# Patient Record
Sex: Male | Born: 1980 | Race: Black or African American | Hispanic: No | Marital: Single | Smoking: Current some day smoker
Health system: Southern US, Community
[De-identification: ages and names within clinical notes are randomized; demographics above are authoritative.]

## PROBLEM LIST (undated history)

## (undated) DIAGNOSIS — I10 Essential (primary) hypertension: Secondary | ICD-10-CM

---

## 2009-05-21 ENCOUNTER — Emergency Department (HOSPITAL_COMMUNITY): Admission: EM | Admit: 2009-05-21 | Discharge: 2009-05-21 | Payer: Self-pay | Admitting: Emergency Medicine

## 2010-08-24 LAB — CBC
Hemoglobin: 14 g/dL (ref 13.0–17.0)
MCHC: 32.8 g/dL (ref 30.0–36.0)
MCV: 83 fL (ref 78.0–100.0)
RDW: 15 % (ref 11.5–15.5)

## 2010-08-24 LAB — URINALYSIS, ROUTINE W REFLEX MICROSCOPIC
Hgb urine dipstick: NEGATIVE
Ketones, ur: NEGATIVE mg/dL
Protein, ur: NEGATIVE mg/dL
Urobilinogen, UA: 0.2 mg/dL (ref 0.0–1.0)

## 2014-01-07 ENCOUNTER — Encounter (HOSPITAL_BASED_OUTPATIENT_CLINIC_OR_DEPARTMENT_OTHER): Payer: Self-pay | Admitting: Emergency Medicine

## 2014-01-07 ENCOUNTER — Emergency Department (HOSPITAL_BASED_OUTPATIENT_CLINIC_OR_DEPARTMENT_OTHER)
Admission: EM | Admit: 2014-01-07 | Discharge: 2014-01-07 | Disposition: A | Discharge status: Home / Self Care | Payer: Self-pay | Attending: Emergency Medicine | Admitting: Emergency Medicine

## 2014-01-07 DIAGNOSIS — K625 Hemorrhage of anus and rectum: Secondary | ICD-10-CM

## 2014-01-07 DIAGNOSIS — F17200 Nicotine dependence, unspecified, uncomplicated: Secondary | ICD-10-CM | POA: Insufficient documentation

## 2014-01-07 DIAGNOSIS — Z79899 Other long term (current) drug therapy: Secondary | ICD-10-CM | POA: Insufficient documentation

## 2014-01-07 DIAGNOSIS — I10 Essential (primary) hypertension: Secondary | ICD-10-CM | POA: Insufficient documentation

## 2014-01-07 DIAGNOSIS — K649 Unspecified hemorrhoids: Secondary | ICD-10-CM | POA: Insufficient documentation

## 2014-01-07 DIAGNOSIS — K645 Perianal venous thrombosis: Secondary | ICD-10-CM | POA: Insufficient documentation

## 2014-01-07 HISTORY — DX: Essential (primary) hypertension: I10

## 2014-01-07 LAB — CBC WITH DIFFERENTIAL/PLATELET
Basophils Absolute: 0 10*3/uL (ref 0.0–0.1)
Basophils Relative: 0 % (ref 0–1)
Eosinophils Absolute: 0.1 10*3/uL (ref 0.0–0.7)
Eosinophils Relative: 2 % (ref 0–5)
HCT: 40.1 % (ref 39.0–52.0)
HEMOGLOBIN: 13.4 g/dL (ref 13.0–17.0)
LYMPHS ABS: 2.1 10*3/uL (ref 0.7–4.0)
Lymphocytes Relative: 26 % (ref 12–46)
MCH: 28.6 pg (ref 26.0–34.0)
MCHC: 33.4 g/dL (ref 30.0–36.0)
MCV: 85.5 fL (ref 78.0–100.0)
MONOS PCT: 8 % (ref 3–12)
Monocytes Absolute: 0.7 10*3/uL (ref 0.1–1.0)
NEUTROS ABS: 5.3 10*3/uL (ref 1.7–7.7)
NEUTROS PCT: 64 % (ref 43–77)
Platelets: 194 10*3/uL (ref 150–400)
RBC: 4.69 MIL/uL (ref 4.22–5.81)
RDW: 14.4 % (ref 11.5–15.5)
WBC: 8.2 10*3/uL (ref 4.0–10.5)

## 2014-01-07 LAB — BASIC METABOLIC PANEL
ANION GAP: 10 (ref 5–15)
BUN: 17 mg/dL (ref 6–23)
CHLORIDE: 101 meq/L (ref 96–112)
CO2: 29 mEq/L (ref 19–32)
Calcium: 9.5 mg/dL (ref 8.4–10.5)
Creatinine, Ser: 1 mg/dL (ref 0.50–1.35)
GFR calc non Af Amer: >90 mL/min (ref >90)
Glucose, Bld: 109 mg/dL — ABNORMAL HIGH (ref 70–99)
POTASSIUM: 3.7 meq/L (ref 3.7–5.3)
Sodium: 140 mEq/L (ref 137–147)

## 2014-01-07 MED ORDER — HYDROCODONE-ACETAMINOPHEN 5-325 MG PO TABS: 1.0000 | ORAL_TABLET | Freq: Four times a day (QID) | ORAL | Status: AC | PRN

## 2014-01-07 NOTE — Discharge Instructions (Signed)
Continue the warm water soaks that she been doing. Take the pain medication judiciously and as prescribed. Return for any increased bleeding. Resource guide provided below to help you find a primary care Dr. Shireen Quanoday's blood counts are well within normal limits.   Emergency Department Resource Guide 1) Find a Doctor and Pay Out of Pocket Although you won't have to find out who is covered by your insurance plan, it is a good idea to ask around and get recommendations. You will then need to call the office and see if the doctor you have chosen will accept you as a new patient and what types of options they offer for patients who are self-pay. Some doctors offer discounts or will set up payment plans for their patients who do not have insurance, but you will need to ask so you aren't surprised when you get to your appointment.  2) Contact Your Local Health Department Not all health departments have doctors that can see patients for sick visits, but many do, so it is worth a call to see if yours does. If you don't know where your local health department is, you can check in your phone book. The CDC also has a tool to help you locate your state's health department, and many state websites also have listings of all of their local health departments.  3) Find a Walk-in Clinic If your illness is not likely to be very severe or complicated, you may want to try a walk in clinic. These are popping up all over the country in pharmacies, drugstores, and shopping centers. They're usually staffed by nurse practitioners or physician assistants that have been trained to treat common illnesses and complaints. They're usually fairly quick and inexpensive. However, if you have serious medical issues or chronic medical problems, these are probably not your best option.  No Primary Care Doctor: - Call Health Connect at  [phone] - they can help you locate a primary care doctor that  accepts your insurance, provides certain  services, etc. - Physician Referral Service- [phone]  Chronic Pain Problems: Organization         Address  Phone   Notes  [facility]  [phone] Patients need to be referred by their primary care doctor.   Medication Assistance: Organization         Address  Phone   Notes  [facility] [address] [place], [place] [address] [phone] --Must be a resident of [place] -- Must have NO insurance coverage whatsoever (no Medicaid/ Medicare, etc.) -- The pt. MUST have a primary care doctor that directs their care regularly and follows them in the community   MedAssist  [phone]   [employer]  [phone]    Agencies that provide inexpensive medical care: Organization         Address  Phone   Notes  [facility]  [phone]   [facility]    [phone]   [facility] [address] [place], [place] [address] [phone]   [facility] [address], [place] [phone]   Planned Parenthood    [phone]   [facility]    [phone]   [facility]  [address],  Phone:  [phone], Fax:  [phone] Hours of Operation:  9 am - 6 pm, M-F.  Also accepts  Medicaid/Medicare and self-pay.  [facility] [place], [address], [place] Phone: [phone], Fax: [phone]. Hours of Operation:  8:30 am - 5:30 pm, M-F.  Also accepts Medicaid and self-pay.  [facility] [place] [address], [place] Phone: [phone]   [place], [place], [place] [phone], Ext. [phone] Mondays & Thursdays: 7-9 AM.  First 15 patients are seen on a first come, first serve basis.    [place] Providers:  Organization         Address  Phone   Notes  [facility] [address], [place] [phone] Also accepts self-pay patients.  [facility] [id number] [place], [place]  [phone]   [place], [address], [place] [phone]   [facility] [address] [place] [phone]   Lucianne Lei [address], [place]   [phone] Only accepts [place] Access [place] patients after they have their name applied to their card.   Self-Pay (no insurance) in [facility]:  Organization         Address  Phone   Notes  Sickle Cell Patients, [facility] [place] [phone]   [facility] [place] [phone]   [facility] [place]  [place], [place], [place] [phone]   [facility]/Dr. Osei-Bonsu  [address], [place] or [address], [place] [phone] Phone number for both [place] and [place] locations is the same.  [facility] [address] [place] [phone]   [facility] [address] [place] or [address] [phone]   [facility] [address] [place] [phone], phone; [phone], fax Sees patients 1st and 3rd Saturday of every month.  Must not qualify for public or private insurance (i.e. Medicaid, Medicare, [place] Health Choice, Veterans' Benefits)  Household income should be no more than 200% of the poverty level The clinic cannot treat you if you are pregnant or think you are pregnant  Sexually transmitted diseases are not treated at the clinic.    Dental Care: Organization         Address  Phone  Notes  [facility] Department of [facility] [place] [phone] Accepts  children up to age 47 who are enrolled in [place] or [place]; pregnant women with a Medicaid card; and children who have applied for Medicaid or [place] Health Choice, but were declined, whose parents can pay a reduced fee at time of service.  [facility] Department of [facility]  [address], [place] [phone] Accepts children up to age 68 who are enrolled in [place] or [place]; pregnant women with a Medicaid card; and children who have applied for Medicaid or [place] Health Choice, but were declined, whose parents can pay a reduced fee at time of service.  [place] [facility] Access PROGRAM  [place] [phone] Patients are seen by appointment only. Walk-ins are not accepted. [place] will see patients 38 years of age and older. Monday - Tuesday (8am-5pm) Most  Wednesdays (8:30-5pm) $30 per visit, cash only  [facility] Adult Dental Access PROGRAM  [address], [facility] [phone] Patients are seen by appointment only. Walk-ins are not accepted. [place] will see patients 71 years of age and older. One Wednesday Evening (Monthly: Volunteer Based).  $30 per visit, cash only  [place]  [phone] for adults; Children under age 31, call Graduate Pediatric Dentistry at [phone]. Children aged 59-14, please call [phone] to request a pediatric application.  Dental services are provided in all areas of dental care including fillings, crowns and bridges, complete and partial dentures, implants, gum treatment, root canals, and extractions. Preventive care is also provided. Treatment is provided to both adults and children. Patients are selected via a lottery and there is often a waiting list.   [facility] [address] [place]  [phone] [email or web address]   [facility] [address] [place], [place] [phone], Ext. [phone] Second and  Fourth Thursday of each month, opens at 6:30 AM; Clinic ends at 9 AM.  Patients are seen on a first-come first-served basis, and a limited number are seen during each clinic.   [facility]  [address] Hillard Danker [place], [place] [phone]   Eligibility Requirements You must have lived in [place], [place], or [place] counties for at least the last three months.   You cannot be eligible for state or federal sponsored [employer], including [employer], [place], or [employer].   You generally cannot be eligible for healthcare insurance through your employer.    How to apply: Eligibility screenings are held every Tuesday and Wednesday afternoon from 1:00 pm until 4:00 pm. You do not need an appointment for the interview!  [facility] [address], [place], [place]   [place]  [place] Department  [place]  [phone]    Behavioral Health Resources in the Community: Intensive Outpatient Programs Organization         Address  Phone  Notes  [place]. [address], [place], [place] [phone]   [facility] Outpatient [address] [place], [place]   ADS: Alcohol & Drug Svcs [address], [place], [place]   [place] [address],  [place], [place] or [phone]   Substance Abuse Resources Organization         Address  Phone  Notes  Alcohol and Drug Services  [phone]   [place]  [phone]   [facility] [place]   Chinita Pester  [phone]   Residential & Outpatient Substance Abuse Program  [phone]   Psychological Services Organization         Address  Phone  Notes  [facility] [place]  [place]  [phone]   [place]  [address], [place] or [phone]    Mobile Crisis Teams Organization         Address  Phone  Notes  [facility], Mobile Crisis Care Unit  [phone]   [facility]  [address] [place], [place]   [place] Levels [address] [place] [phone]    Self-Help/Support Groups Organization         Address  Phone  Notes  [facility]oc[facility] of [place] - variety of support groups  [place] Call for more information  Narcotics Anonymous (NA), [facility] [address], [employer] [place]  2 meetings at this location   Special educational needs teacher         Address  Phone  Notes  ASAP Residential Treatment [place],    [place]  [phone]   [facility]  [address] [place] [address]2831, [place], [place]   [place], [place] [phone] Admissions: 8am-3pm M-F  Incentives Substance [place] [address]    [place], [place] [phone]   [facility] [address] [place], [place], [place]   [facility] [address],  [place], [place]   [facility] - Intensive Outpatient [address] Kristeen Mans [address], [place], [place]   [facility] [address]  [place], [place] [phone] or [phone]   [facility] (RTS) [address] [place], [place] Accepts Medicaid  [facility] [address],  [place] [phone] Substance Abuse/Addiction Treatment   [facility] Organization         Address  Phone  Notes  [facility]  [phone]   Domenic Schwab, PhD [address] Arlis Porta [place], [place]   [phone] or [phone]   [place], [place] [phone]   [facility] [address] [place], [place] [phone] Insurance/Medicaid/sponsorship through [facility] and Families [address], [address] [place]                                    [place], [place] [phone] [place] [address], [place] [phone]    Dr. Adele Schilder  [phone]   [facility] Dept. 1) [address] [place] 2) [place] 3)  [place] 65, [place] [phone]  [phone]   [place] [phone] or [phone] (After Hours)

## 2014-01-07 NOTE — ED Provider Notes (Signed)
CSN: [id number]     Arrival date & time 01/07/14  1251 History   First MD Initiated Contact with Patient 01/07/14 1332     Chief Complaint  Patient presents with  . Hemorrhoids     (Consider location/radiation/quality/duration/timing/severity/associated sxs/prior Treatment) The history is provided by the patient.   33 year old male with 3 day history of rectal bleeding and discomfort felt to be due to hemorrhoids. No bowel pain no nausea no vomiting no fevers hurting to PE. Does not feel lightheaded or feel like his that pass out.  Past Medical History  Diagnosis Date  . Hypertension    History reviewed. No pertinent past surgical history. History reviewed. No pertinent family history. History  Substance Use Topics  . Smoking status: Current Some Day Smoker  . Smokeless tobacco: Not on file  . Alcohol Use: No    Review of Systems  Constitutional: Negative for fever.  HENT: Negative for congestion.   Eyes: Negative for redness.  Respiratory: Negative for shortness of breath.   Cardiovascular: Negative for chest pain.  Gastrointestinal: Positive for anal bleeding. Negative for nausea, vomiting and abdominal pain.  Genitourinary: Negative for dysuria.  Musculoskeletal: Negative for back pain.  Skin: Negative for rash.  Neurological: Negative for headaches.  Hematological: Does not bruise/bleed easily.  Psychiatric/Behavioral: Negative for confusion.      Allergies  Review of patient's allergies indicates no known allergies.  Home Medications   Prior to Admission medications   Medication Sig Start Date End Date Taking? Authorizing Provider  hydrochlorothiazide (HYDRODIURIL) 25 MG tablet Take 25 mg by mouth daily.   Yes Historical Provider, MD  HYDROcodone-acetaminophen (NORCO/VICODIN) 5-325 MG per tablet Take 1-2 tablets by mouth every 6 (six) hours as needed for moderate pain. 01/07/14   Vanetta MuldersScott Ahron Hulbert, MD   BP 144/85  Pulse 70  Temp(Src) 98.4 F (36.9 C) (Oral)   Resp 16  Ht 6' (1.829 m)  Wt 165 lb (74.844 kg)  BMI 22.37 kg/m2  SpO2 100% Physical Exam  Nursing note and vitals reviewed. Constitutional: He is oriented to person, place, and time. He appears well-developed and well-nourished. No distress.  HENT:  Head: Normocephalic and atraumatic.  Mouth/Throat: Oropharynx is clear and moist.  Eyes: Conjunctivae and EOM are normal. Pupils are equal, round, and reactive to light.  Neck: Normal range of motion.  Cardiovascular: Normal rate, regular rhythm and normal heart sounds.   Pulmonary/Chest: Effort normal and breath sounds normal. No respiratory distress.  Abdominal: Soft. Bowel sounds are normal. There is no tenderness.  Genitourinary:  Patient with skin tags and thrombosed external hemorrhoid with clot and some bleeding. No prolapsed internal hemorrhoids.  Neurological: He is alert and oriented to person, place, and time. No cranial nerve deficit. He exhibits normal muscle tone. Coordination normal.  Skin: Skin is warm. No rash noted.    ED Course  Procedures (including critical care time) Labs Review Labs Reviewed  BASIC METABOLIC PANEL - Abnormal; Notable for the following:    Glucose, Bld 109 (*)    All other components within normal limits  CBC WITH DIFFERENTIAL   Results for orders placed during the hospital encounter of 01/07/14  BASIC METABOLIC PANEL      Result Value Ref Range   Sodium 140  137 - 147 mEq/L   Potassium 3.7  3.7 - 5.3 mEq/L   Chloride 101  96 - 112 mEq/L   CO2 29  19 - 32 mEq/L   Glucose, Bld 109 (*) 70 - 99  mg/dL   BUN 17  6 - 23 mg/dL   Creatinine, Ser 1.61  0.50 - 1.35 mg/dL   Calcium 9.5  8.4 - 09.6 mg/dL   GFR calc non Af Amer >90  >90 mL/min   GFR calc Af Amer >90  >90 mL/min   Anion gap 10  5 - 15  CBC WITH DIFFERENTIAL      Result Value Ref Range   WBC 8.2  4.0 - 10.5 K/uL   RBC 4.69  4.22 - 5.81 MIL/uL   Hemoglobin 13.4  13.0 - 17.0 g/dL   HCT 04.5  40.9 - 81.1 %   MCV 85.5  78.0 - 100.0  fL   MCH 28.6  26.0 - 34.0 pg   MCHC 33.4  30.0 - 36.0 g/dL   RDW 91.4  78.2 - 95.6 %   Platelets 194  150 - 400 K/uL   Neutrophils Relative % 64  43 - 77 %   Neutro Abs 5.3  1.7 - 7.7 K/uL   Lymphocytes Relative 26  12 - 46 %   Lymphs Abs 2.1  0.7 - 4.0 K/uL   Monocytes Relative 8  3 - 12 %   Monocytes Absolute 0.7  0.1 - 1.0 K/uL   Eosinophils Relative 2  0 - 5 %   Eosinophils Absolute 0.1  0.0 - 0.7 K/uL   Basophils Relative 0  0 - 1 %   Basophils Absolute 0.0  0.0 - 0.1 K/uL     Imaging Review No results found.   EKG Interpretation None      MDM   Final diagnoses:  Rectal bleeding  Thrombosed external hemorrhoid    Patient with some rectal bleeding most likely related to hemorrhoids. Suspect internal hemorrhoids as well as external. External is visible with a thrombosed area that could have caused some of the bleeding. Patient's had a history of internal hemorrhoids in the past. No significant blood loss based on labs today. Patient will continue sitz baths. Pain medication provided work note provided. Patient will return for any new or worse symptoms. Patient's vital signs showed no tachycardia no hypotension.    Vanetta Mulders, MD 01/07/14 1530

## 2014-01-07 NOTE — ED Notes (Signed)
Pt c/o hemorrhoid bleeding x 3 days

## 2019-07-22 ENCOUNTER — Emergency Department (HOSPITAL_BASED_OUTPATIENT_CLINIC_OR_DEPARTMENT_OTHER): Payer: No Typology Code available for payment source

## 2019-07-22 ENCOUNTER — Encounter (HOSPITAL_BASED_OUTPATIENT_CLINIC_OR_DEPARTMENT_OTHER): Payer: Self-pay

## 2019-07-22 ENCOUNTER — Other Ambulatory Visit: Payer: Self-pay

## 2019-07-22 ENCOUNTER — Emergency Department (HOSPITAL_BASED_OUTPATIENT_CLINIC_OR_DEPARTMENT_OTHER)
Admission: EM | Admit: 2019-07-22 | Discharge: 2019-07-22 | Disposition: A | Discharge status: Home / Self Care | Payer: No Typology Code available for payment source | Attending: Emergency Medicine | Admitting: Emergency Medicine

## 2019-07-22 DIAGNOSIS — Y9389 Activity, other specified: Secondary | ICD-10-CM | POA: Insufficient documentation

## 2019-07-22 DIAGNOSIS — F17210 Nicotine dependence, cigarettes, uncomplicated: Secondary | ICD-10-CM | POA: Insufficient documentation

## 2019-07-22 DIAGNOSIS — S022XXA Fracture of nasal bones, initial encounter for closed fracture: Secondary | ICD-10-CM | POA: Insufficient documentation

## 2019-07-22 DIAGNOSIS — Y999 Unspecified external cause status: Secondary | ICD-10-CM | POA: Diagnosis not present

## 2019-07-22 DIAGNOSIS — Y92410 Unspecified street and highway as the place of occurrence of the external cause: Secondary | ICD-10-CM | POA: Diagnosis not present

## 2019-07-22 DIAGNOSIS — I10 Essential (primary) hypertension: Secondary | ICD-10-CM | POA: Insufficient documentation

## 2019-07-22 DIAGNOSIS — S0990XA Unspecified injury of head, initial encounter: Secondary | ICD-10-CM | POA: Diagnosis present

## 2019-07-22 MED ORDER — METHOCARBAMOL 500 MG PO TABS: 500.0000 mg | ORAL_TABLET | Freq: Three times a day (TID) | ORAL | 0 refills | Status: AC | PRN

## 2019-07-22 MED ORDER — KETOROLAC TROMETHAMINE 30 MG/ML IJ SOLN
30.0000 mg | Freq: Once | INTRAMUSCULAR | Status: AC
  Administered 2019-07-22: 19:00:00 30 mg via INTRAMUSCULAR
  Filled 2019-07-22: qty 1

## 2019-07-22 MED ORDER — IBUPROFEN 800 MG PO TABS: 800.0000 mg | ORAL_TABLET | Freq: Three times a day (TID) | ORAL | 0 refills | Status: AC | PRN
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
## 2020-01-05 ENCOUNTER — Encounter (HOSPITAL_BASED_OUTPATIENT_CLINIC_OR_DEPARTMENT_OTHER): Payer: Self-pay | Admitting: Emergency Medicine

## 2020-01-05 ENCOUNTER — Emergency Department (HOSPITAL_BASED_OUTPATIENT_CLINIC_OR_DEPARTMENT_OTHER)
Admission: EM | Admit: 2020-01-05 | Discharge: 2020-01-05 | Disposition: A | Discharge status: Home / Self Care | Payer: Self-pay | Attending: Emergency Medicine | Admitting: Emergency Medicine

## 2020-01-05 ENCOUNTER — Other Ambulatory Visit: Payer: Self-pay

## 2020-01-05 DIAGNOSIS — Y999 Unspecified external cause status: Secondary | ICD-10-CM | POA: Insufficient documentation

## 2020-01-05 DIAGNOSIS — Y92410 Unspecified street and highway as the place of occurrence of the external cause: Secondary | ICD-10-CM | POA: Insufficient documentation

## 2020-01-05 DIAGNOSIS — M25562 Pain in left knee: Secondary | ICD-10-CM | POA: Insufficient documentation

## 2020-01-05 DIAGNOSIS — Y9389 Activity, other specified: Secondary | ICD-10-CM | POA: Insufficient documentation

## 2020-01-05 DIAGNOSIS — Z5321 Procedure and treatment not carried out due to patient leaving prior to being seen by health care provider: Secondary | ICD-10-CM | POA: Insufficient documentation
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
## 2022-08-17 ENCOUNTER — Emergency Department (HOSPITAL_BASED_OUTPATIENT_CLINIC_OR_DEPARTMENT_OTHER): Payer: Self-pay

## 2022-08-17 ENCOUNTER — Other Ambulatory Visit: Payer: Self-pay

## 2022-08-17 ENCOUNTER — Emergency Department (HOSPITAL_BASED_OUTPATIENT_CLINIC_OR_DEPARTMENT_OTHER)
Admission: EM | Admit: 2022-08-17 | Discharge: 2022-08-18 | Disposition: A | Discharge status: Home / Self Care | Payer: Self-pay | Attending: Emergency Medicine | Admitting: Emergency Medicine

## 2022-08-17 DIAGNOSIS — Y990 Civilian activity done for income or pay: Secondary | ICD-10-CM | POA: Diagnosis not present

## 2022-08-17 DIAGNOSIS — S59911A Unspecified injury of right forearm, initial encounter: Secondary | ICD-10-CM | POA: Diagnosis present

## 2022-08-17 DIAGNOSIS — S51821A Laceration with foreign body of right forearm, initial encounter: Secondary | ICD-10-CM | POA: Diagnosis not present

## 2022-08-17 DIAGNOSIS — W19XXXA Unspecified fall, initial encounter: Secondary | ICD-10-CM | POA: Insufficient documentation

## 2022-08-17 DIAGNOSIS — Z23 Encounter for immunization: Secondary | ICD-10-CM | POA: Diagnosis not present

## 2022-08-17 MED ORDER — TETANUS-DIPHTH-ACELL PERTUSSIS 5-2.5-18.5 LF-MCG/0.5 IM SUSY
0.5000 mL | PREFILLED_SYRINGE | Freq: Once | INTRAMUSCULAR | Status: AC
  Administered 2022-08-17: 0.5 mL via INTRAMUSCULAR
  Filled 2022-08-17: qty 0.5

## 2022-08-17 MED ORDER — LIDOCAINE-EPINEPHRINE (PF) 1 %-1:200000 IJ SOLN
20.0000 mL | Freq: Once | INTRAMUSCULAR | Status: AC
  Administered 2022-08-18: 20 mL
  Filled 2022-08-17: qty 30

## 2022-08-17 NOTE — ED Triage Notes (Signed)
Pt fell at work 2 hours ago and landed on some wood and glass causing small laceration to right forearm. Sterile dressing applied. Bleeding controlled.

## 2022-08-17 NOTE — ED Provider Notes (Signed)
[facility] Provider Note   CSN: [id number] Arrival date & time: 08/17/22  1935     History {Add pertinent medical, surgical, social history, OB history to HPI:1} Chief Complaint  Patient presents with   Laceration    Austin Young is a 42 y.o. male.  The history is provided by the patient.  Laceration He fell at work landing on some wooden grass suffering lacerations to the right forearm.  Last tetanus immunization is unknown.  He denies other injury.   Home Medications Prior to Admission medications   Medication Sig Start Date End Date Taking? Authorizing Provider  hydrochlorothiazide (HYDRODIURIL) 25 MG tablet Take 25 mg by mouth daily.    [provider]  HYDROcodone-acetaminophen (NORCO/VICODIN) 5-325 MG per tablet Take 1-2 tablets by mouth every 6 (six) hours as needed for moderate pain. 01/07/14   Fredia Sorrow, MD  ibuprofen (ADVIL) 800 MG tablet Take 1 tablet (800 mg total) by mouth every 8 (eight) hours as needed for moderate pain. 07/22/19   Long, Wonda Olds, MD  methocarbamol (ROBAXIN) 500 MG tablet Take 1 tablet (500 mg total) by mouth every 8 (eight) hours as needed for muscle spasms. 07/22/19   Long, Wonda Olds, MD      Allergies    Patient has no known allergies.    Review of Systems   Review of Systems  All other systems reviewed and are negative.   Physical Exam Updated Vital Signs BP (!) 133/94 (BP Location: Left Arm)   Pulse 83   Temp 98 F (36.7 C) (Oral)   Resp 14   Ht 5\' 11"  (1.803 m)   Wt 83.9 kg   SpO2 98%   BMI 25.80 kg/m  Physical Exam Vitals and nursing note reviewed.   42 year old male, resting comfortably and in no acute distress. Vital signs are significant for borderline elevated blood pressure. Oxygen saturation is 98%, which is normal. Head is normocephalic and atraumatic. PERRLA, EOMI.  Lungs are clear without rales, wheezes, or rhonchi. Chest is nontender. Heart has  regular rate and rhythm without murmur. Abdomen is soft, flat, nontender. Extremities: Irregular lacerations are present on the volar surface of the distal right forearm, radial side.  Distal neurovascular exam is intact with normal tendon function, normal sensation, prompt capillary refill. Skin is warm and dry without rash. Neurologic: Awake and alert, moves all extremities equally.  ED Results / Procedures / Treatments    Radiology DG Forearm Right  Result Date: 08/17/2022 CLINICAL DATA:  Laceration EXAM: RIGHT FOREARM - 2 VIEW COMPARISON:  None Available. FINDINGS: There are 4 foreign bodies in the anterior soft tissues of the distal forearm measuring up to 4 mm x 4 mm x 3 mm. There surrounding soft tissue swelling. There is no acute fracture or dislocation. IMPRESSION: 4 foreign bodies in the anterior soft tissues of the distal forearm. Electronically Signed   By: Ronney Asters M.D.   On: 08/17/2022 22:25    Procedures Procedures  {Document cardiac monitor, telemetry assessment procedure when appropriate:1}  Medications Ordered in ED Medications - No data to display  ED Course/ Medical Decision Making/ A&P   {   Click here for ABCD2, HEART and other calculatorsREFRESH Note before signing :1}                          Medical Decision Making Amount and/or Complexity of Data Reviewed Radiology: ordered.   Laceration  to right forearm.  X-rays show presence of for subcutaneous foreign bodies.  I have independently viewed the images, and agree with the radiologist's interpretation.  {Document critical care time when appropriate:1} {Document review of labs and clinical decision tools ie heart score, Chads2Vasc2 etc:1}  {Document your independent review of radiology images, and any outside records:1} {Document your discussion with family members, caretakers, and with consultants:1} {Document social determinants of health affecting pt's care:1} {Document your decision making why or  why not admission, treatments were needed:1} Final Clinical Impression(s) / ED Diagnoses Final diagnoses:  None    Rx / DC Orders ED Discharge Orders     None

## 2022-08-18 ENCOUNTER — Emergency Department (HOSPITAL_BASED_OUTPATIENT_CLINIC_OR_DEPARTMENT_OTHER): Payer: Self-pay

## 2022-08-18 MED ORDER — CEPHALEXIN 250 MG PO CAPS
500.0000 mg | ORAL_CAPSULE | Freq: Once | ORAL | Status: AC
  Administered 2022-08-18: 500 mg via ORAL
  Filled 2022-08-18: qty 2

## 2022-08-18 MED ORDER — CEPHALEXIN 500 MG PO CAPS: 500.0000 mg | ORAL_CAPSULE | Freq: Three times a day (TID) | ORAL | 0 refills | Status: AC

## 2022-08-18 NOTE — Discharge Instructions (Signed)
You had 4 foreign bodies removed from your arm.  X-ray following removal showed no residual foreign bodies.  However, you are at risk for infection.  You have been given a prescription for an antibiotic.  If signs of infection develop, please return for further evaluation.  Otherwise, have sutures removed in 7-10 days.
